# Patient Record
Sex: Male | Born: 2000 | Race: Black or African American | Hispanic: No | Marital: Single | Smoking: Never smoker
Health system: Southern US, Community
[De-identification: ages and names within clinical notes are randomized; demographics above are authoritative.]

---
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
## 2021-02-09 ENCOUNTER — Emergency Department (HOSPITAL_BASED_OUTPATIENT_CLINIC_OR_DEPARTMENT_OTHER): Payer: BC Managed Care – PPO

## 2021-02-09 ENCOUNTER — Encounter (HOSPITAL_BASED_OUTPATIENT_CLINIC_OR_DEPARTMENT_OTHER): Payer: Self-pay | Admitting: *Deleted

## 2021-02-09 ENCOUNTER — Other Ambulatory Visit: Payer: Self-pay

## 2021-02-09 ENCOUNTER — Emergency Department (HOSPITAL_BASED_OUTPATIENT_CLINIC_OR_DEPARTMENT_OTHER)
Admission: EM | Admit: 2021-02-09 | Discharge: 2021-02-09 | Disposition: A | Discharge status: Home / Self Care | Payer: BC Managed Care – PPO | Attending: Emergency Medicine | Admitting: Emergency Medicine

## 2021-02-09 DIAGNOSIS — Z20822 Contact with and (suspected) exposure to covid-19: Secondary | ICD-10-CM | POA: Diagnosis not present

## 2021-02-09 DIAGNOSIS — R1084 Generalized abdominal pain: Secondary | ICD-10-CM | POA: Diagnosis not present

## 2021-02-09 DIAGNOSIS — R1010 Upper abdominal pain, unspecified: Secondary | ICD-10-CM | POA: Diagnosis present

## 2021-02-09 DIAGNOSIS — R110 Nausea: Secondary | ICD-10-CM | POA: Insufficient documentation

## 2021-02-09 LAB — COMPREHENSIVE METABOLIC PANEL
ALT: 18 U/L (ref 0–44)
AST: 20 U/L (ref 15–41)
Albumin: 4 g/dL (ref 3.5–5.0)
Alkaline Phosphatase: 79 U/L (ref 38–126)
Anion gap: 8 (ref 5–15)
BUN: 9 mg/dL (ref 6–20)
CO2: 24 mmol/L (ref 22–32)
Calcium: 8.9 mg/dL (ref 8.9–10.3)
Chloride: 104 mmol/L (ref 98–111)
Creatinine, Ser: 0.68 mg/dL (ref 0.61–1.24)
GFR, Estimated: >60 mL/min (ref >60)
Glucose, Bld: 92 mg/dL (ref 70–99)
Potassium: 3.7 mmol/L (ref 3.5–5.1)
Sodium: 136 mmol/L (ref 135–145)
Total Bilirubin: 0.4 mg/dL (ref 0.3–1.2)
Total Protein: 6.8 g/dL (ref 6.5–8.1)

## 2021-02-09 LAB — URINALYSIS, ROUTINE W REFLEX MICROSCOPIC
Bilirubin Urine: NEGATIVE
Glucose, UA: NEGATIVE mg/dL
Hgb urine dipstick: NEGATIVE
Ketones, ur: NEGATIVE mg/dL
Leukocytes,Ua: NEGATIVE
Nitrite: NEGATIVE
Protein, ur: NEGATIVE mg/dL
Specific Gravity, Urine: <1.005 — ABNORMAL LOW (ref 1.005–1.030)
pH: 8 (ref 5.0–8.0)

## 2021-02-09 LAB — CBC WITH DIFFERENTIAL/PLATELET
Abs Immature Granulocytes: 0.02 10*3/uL (ref 0.00–0.07)
Basophils Absolute: 0 10*3/uL (ref 0.0–0.1)
Basophils Relative: 0 %
Eosinophils Absolute: 0.3 10*3/uL (ref 0.0–0.5)
Eosinophils Relative: 3 %
HCT: 44.6 % (ref 39.0–52.0)
Hemoglobin: 15.2 g/dL (ref 13.0–17.0)
Immature Granulocytes: 0 %
Lymphocytes Relative: 39 %
Lymphs Abs: 4.3 10*3/uL — ABNORMAL HIGH (ref 0.7–4.0)
MCH: 27.7 pg (ref 26.0–34.0)
MCHC: 34.1 g/dL (ref 30.0–36.0)
MCV: 81.4 fL (ref 80.0–100.0)
Monocytes Absolute: 0.6 10*3/uL (ref 0.1–1.0)
Monocytes Relative: 5 %
Neutro Abs: 6 10*3/uL (ref 1.7–7.7)
Neutrophils Relative %: 53 %
Platelets: 288 10*3/uL (ref 150–400)
RBC: 5.48 MIL/uL (ref 4.22–5.81)
RDW: 13.5 % (ref 11.5–15.5)
WBC: 11.2 10*3/uL — ABNORMAL HIGH (ref 4.0–10.5)
nRBC: 0 % (ref 0.0–0.2)

## 2021-02-09 LAB — LIPASE, BLOOD: Lipase: 28 U/L (ref 11–51)

## 2021-02-09 MED ORDER — ONDANSETRON 4 MG PO TBDP: 4.0000 mg | ORAL_TABLET | Freq: Three times a day (TID) | ORAL | 0 refills | Status: AC | PRN

## 2021-02-09 MED ORDER — IOHEXOL 300 MG/ML  SOLN
100.0000 mL | Freq: Once | INTRAMUSCULAR | Status: AC | PRN
  Administered 2021-02-09: 100 mL via INTRAVENOUS

## 2021-02-09 MED ORDER — ONDANSETRON HCL 4 MG/2ML IJ SOLN
4.0000 mg | Freq: Once | INTRAMUSCULAR | Status: AC
  Administered 2021-02-09: 4 mg via INTRAVENOUS
  Filled 2021-02-09: qty 2

## 2021-02-09 MED ORDER — FENTANYL CITRATE (PF) 100 MCG/2ML IJ SOLN
50.0000 ug | Freq: Once | INTRAMUSCULAR | Status: AC
  Administered 2021-02-09: 50 ug via INTRAVENOUS
  Filled 2021-02-09: qty 2

## 2021-02-09 NOTE — ED Notes (Signed)
Water given for PO challenge.

## 2021-02-09 NOTE — ED Provider Notes (Signed)
[facility] EMERGENCY DEPARTMENT Provider Note   CSN: [id number] Arrival date & time: 02/09/21  1859     History Chief Complaint  Patient presents with  . Abdominal Pain    Lawrence Cisneros is a 20 y.o. male.  Presents to ER with concern for abdominal pain.  States that pain started this afternoon.  Yesterday felt fine.  Has had some associated nausea but no vomiting or diarrhea.  Pain is primarily in his upper abdomen.  Per report negative for COVID and flu.  Sent from urgent care for further eval.  Denies medical problems.  HPI     History reviewed. No pertinent past medical history.  There are no problems to display for this patient.   Past Surgical History:  Procedure Laterality Date  . NOSE SURGERY         No family history on file.  Social History   Tobacco Use  . Smoking status: Never Smoker  . Smokeless tobacco: Never Used  Substance Use Topics  . Alcohol use: No    Home Medications Prior to Admission medications   Medication Sig Start Date End Date Taking? Authorizing Provider  ondansetron (ZOFRAN ODT) 4 MG disintegrating tablet Take 1 tablet (4 mg total) by mouth every 8 (eight) hours as needed for nausea or vomiting. 02/09/21  Yes Amiir Heckard, Quitman Livings, MD  cephALEXin (KEFLEX) 500 MG capsule Take 1 capsule (500 mg total) by mouth 3 (three) times daily. 11/26/15   Hagler, Jami L, PA-C  doxycycline (VIBRAMYCIN) 100 MG capsule Take 1 capsule (100 mg total) by mouth 2 (two) times daily. 06/24/15   Viviano Simas, NP    Allergies    Patient has no known allergies.  Review of Systems   Review of Systems  Constitutional: Negative for chills and fever.  HENT: Negative for ear pain and sore throat.   Eyes: Negative for pain and visual disturbance.  Respiratory: Negative for cough and shortness of breath.   Cardiovascular: Negative for chest pain and palpitations.  Gastrointestinal: Positive for abdominal pain and nausea. Negative for vomiting.   Genitourinary: Negative for dysuria and hematuria.  Musculoskeletal: Negative for arthralgias and back pain.  Skin: Negative for color change and rash.  Neurological: Negative for seizures and syncope.  All other systems reviewed and are negative.   Physical Exam Updated Vital Signs BP 124/77 (BP Location: Left Arm)   Pulse (!) 50   Temp (!) 97.5 F (36.4 C) (Oral)   Resp (!) 21   Ht 6\' 3"  (1.905 m)   Wt 93.9 kg   SpO2 98%   BMI 25.87 kg/m   Physical Exam Vitals and nursing note reviewed.  Constitutional:      Appearance: He is well-developed.  HENT:     Head: Normocephalic and atraumatic.  Eyes:     Conjunctiva/sclera: Conjunctivae normal.  Cardiovascular:     Rate and Rhythm: Normal rate and regular rhythm.     Heart sounds: No murmur heard.   Pulmonary:     Effort: Pulmonary effort is normal. No respiratory distress.     Breath sounds: Normal breath sounds.  Abdominal:     Palpations: Abdomen is soft.     Tenderness: There is no abdominal tenderness.     Comments: Some generalized tenderness to palpation, no rebound or guarding  Musculoskeletal:     Cervical back: Neck supple.  Skin:    General: Skin is warm and dry.  Neurological:     Mental Status: He is  alert.     ED Results / Procedures / Treatments   Labs (all labs ordered are listed, but only abnormal results are displayed) Labs Reviewed  CBC WITH DIFFERENTIAL/PLATELET - Abnormal; Notable for the following components:      Result Value   WBC 11.2 (*)    Lymphs Abs 4.3 (*)    All other components within normal limits  URINALYSIS, ROUTINE W REFLEX MICROSCOPIC - Abnormal; Notable for the following components:   Specific Gravity, Urine <1.005 (*)    All other components within normal limits  COMPREHENSIVE METABOLIC PANEL  LIPASE, BLOOD    EKG None  Radiology CT ABDOMEN PELVIS W CONTRAST  Result Date: 02/09/2021 CLINICAL DATA:  Epigastric and left abdominal pain today.  Nausea. EXAM: CT  ABDOMEN AND PELVIS WITH CONTRAST TECHNIQUE: Multidetector CT imaging of the abdomen and pelvis was performed using the standard protocol following bolus administration of intravenous contrast. CONTRAST:  OMNIPAQUE IOHEXOL 300 MG/ML  SOLN COMPARISON:  None. FINDINGS: Lower chest: Lung bases are clear. Hepatobiliary: No focal liver abnormality is seen. No gallstones, gallbladder wall thickening, or biliary dilatation. Pancreas: Unremarkable. No pancreatic ductal dilatation or surrounding inflammatory changes. Spleen: Normal in size without focal abnormality. Adrenals/Urinary Tract: No adrenal gland nodules. Hyperdense foci in both kidneys may represent stones but more likely are due to early concentration of contrast material in the calices. Nephrograms are symmetrical. No hydronephrosis or hydroureter. Bladder wall is thickened which may indicate cystitis. Stomach/Bowel: Stomach is within normal limits. Appendix appears normal. No evidence of bowel wall thickening, distention, or inflammatory changes. Vascular/Lymphatic: No significant vascular findings are present. No enlarged abdominal or pelvic lymph nodes. Reproductive: Prostate is unremarkable. Other: Small periumbilical hernia containing fat. No free air or free fluid in the abdomen. Musculoskeletal: No acute or significant osseous findings. IMPRESSION: 1. Bladder wall thickening may indicate cystitis. 2. Hyperdense foci in both kidneys may represent stones but more likely are due to early concentration of contrast material in the calices. No hydronephrosis or hydroureter. 3. Small periumbilical hernia containing fat. Electronically Signed   By: Burman Nieves M.D.   On: 02/09/2021 21:52    Procedures Procedures   Medications Ordered in ED Medications  ondansetron (ZOFRAN) injection 4 mg (4 mg Intravenous Given 02/09/21 2025)  fentaNYL (SUBLIMAZE) injection 50 mcg (50 mcg Intravenous Given 02/09/21 2024)  iohexol (OMNIPAQUE) 300 MG/ML solution  100 mL (100 mLs Intravenous Contrast Given 02/09/21 2121)    ED Course  I have reviewed the triage vital signs and the nursing notes.  Pertinent labs & imaging results that were available during my care of the patient were reviewed by me and considered in my medical decision making (see chart for details).    MDM Rules/Calculators/A&P                         20 year old male presenting to ER with concern for abdominal pain.  Basic labs are all stable.  UA negative for infection.  CT negative for acute pathology.  Did demonstrate some mild bladder wall thickening but given UA, doubt cystitis.  No specific urinary symptoms.  On reassessment his symptoms had resolved and he tolerated p.o. without difficulty.  Discharged home with mother.  Recommend follow-up with primary doctor, return for worsening symptoms.  After the discussed management above, the patient was determined to be safe for discharge.  The patient was in agreement with this plan and all questions regarding their care were answered.  ED return precautions were discussed and the patient will return to the ED with any significant worsening of condition.   Final Clinical Impression(s) / ED Diagnoses Final diagnoses:  Generalized abdominal pain    Rx / DC Orders ED Discharge Orders         Ordered    ondansetron (ZOFRAN ODT) 4 MG disintegrating tablet  Every 8 hours PRN        02/09/21 2259           Milagros Loll, MD 02/09/21 2337

## 2021-02-09 NOTE — Discharge Instructions (Signed)
Take Zofran as needed for nausea or pain.  Follow-up with your primary care doctor.  If your pain and nausea is uncontrolled, recommend return to ER for reassessment.

## 2021-02-09 NOTE — ED Notes (Signed)
Patient transported to CT 

## 2021-02-09 NOTE — ED Triage Notes (Signed)
C/o mid abd pain x 5 hrs today sent here by [facility] for eval denies d/v. NEg for covid and flu

## 2021-08-05 ENCOUNTER — Ambulatory Visit: Payer: BC Managed Care – PPO | Admitting: Sports Medicine

## 2022-01-13 IMAGING — CT CT ABD-PELV W/ CM
2 of 4 series · 15 of 46 positions shown, 17 images · IV contrast (Omnipaque)
Comparison: None.

CLINICAL DATA: Epigastric and left abdominal pain today.  Nausea.

EXAM:
CT ABDOMEN AND PELVIS WITH CONTRAST
TECHNIQUE: Multidetector CT imaging of the abdomen and pelvis was performed
using the standard protocol following bolus administration of
intravenous contrast.
CONTRAST:  100mL OMNIPAQUE IOHEXOL 300 MG/ML  SOLN

[Series 2: axial st · axial · 0.98mm/px · z∈[-641,-196]mm · 12 of 103 slices shown, 14 images]
[im 9/103  soft-tissue]
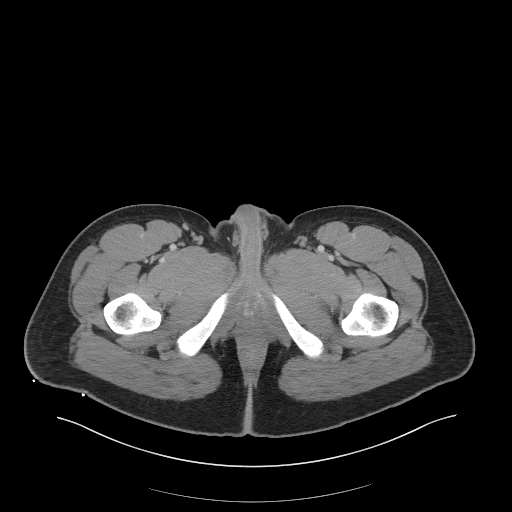
[im 9/103  bone]
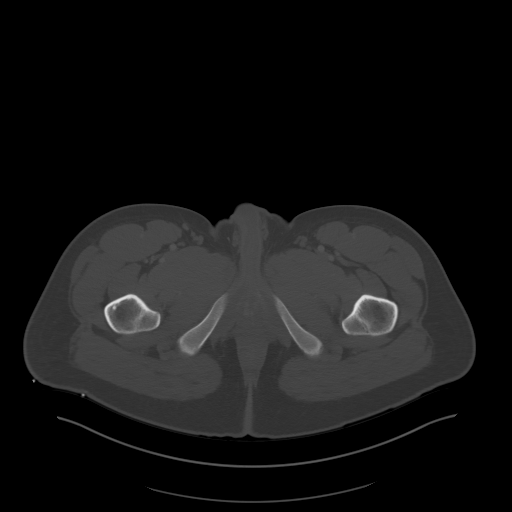
[im 17/103  soft-tissue]
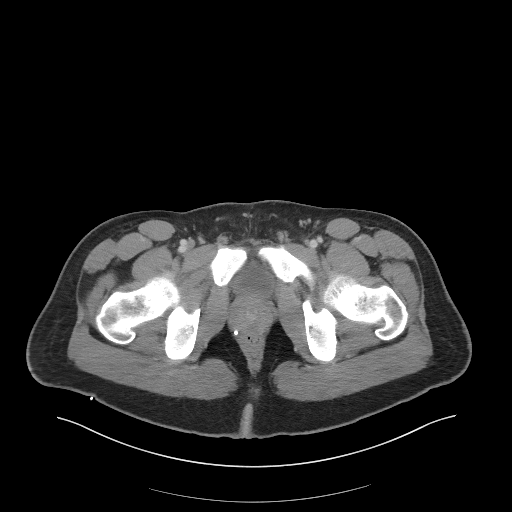
[im 25/103  soft-tissue]
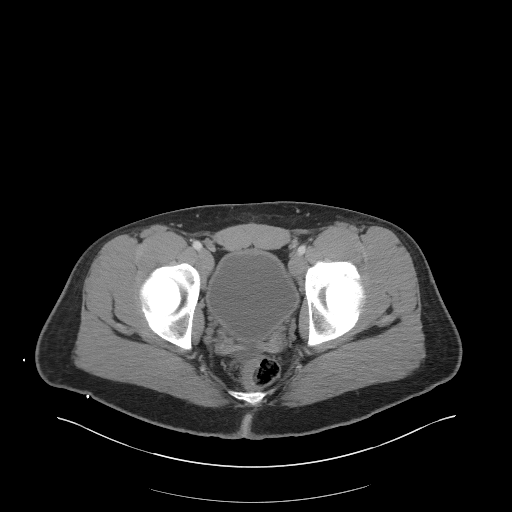
[im 33/103  soft-tissue]
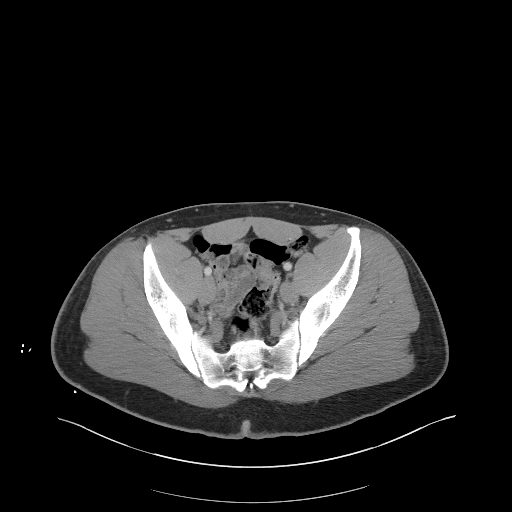
[im 41/103  soft-tissue]
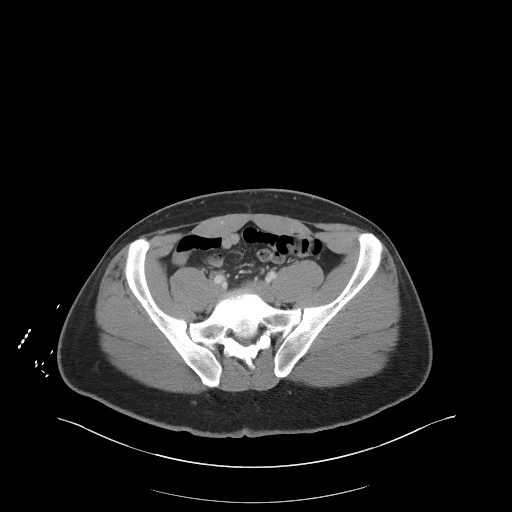
[im 49/103  soft-tissue]
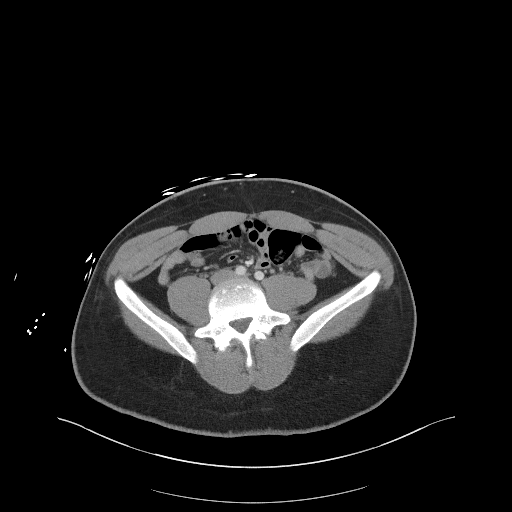
[im 58/103  soft-tissue]
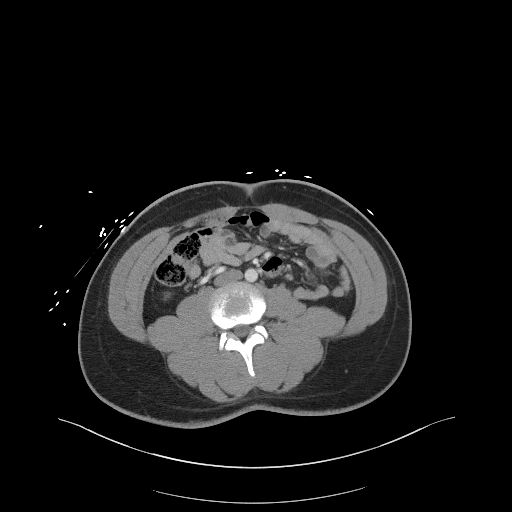
[im 66/103  soft-tissue]
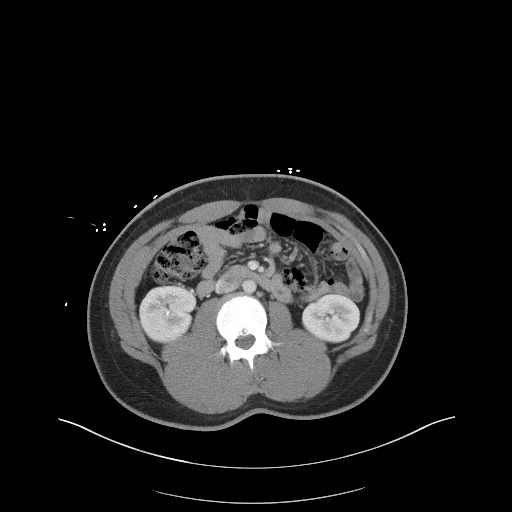
[im 74/103  soft-tissue]
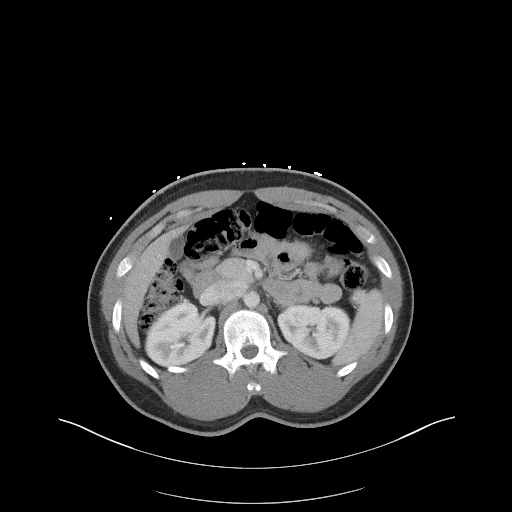
[im 74/103  bone]
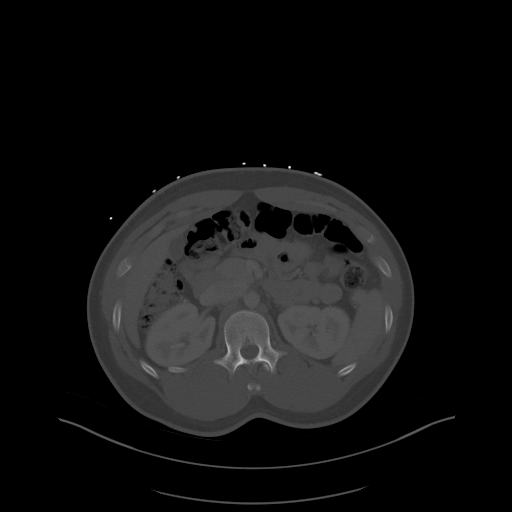
[im 82/103  soft-tissue]
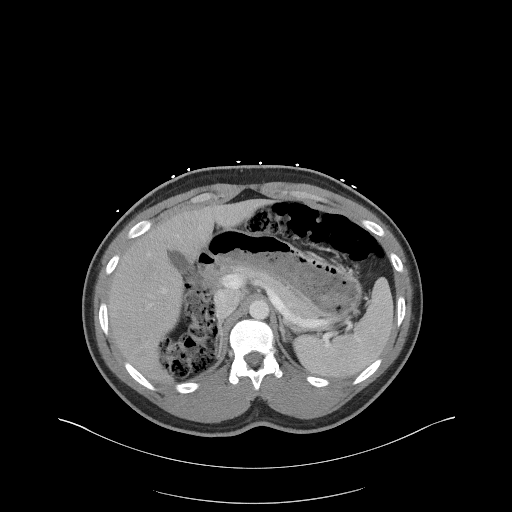
[im 90/103  soft-tissue]
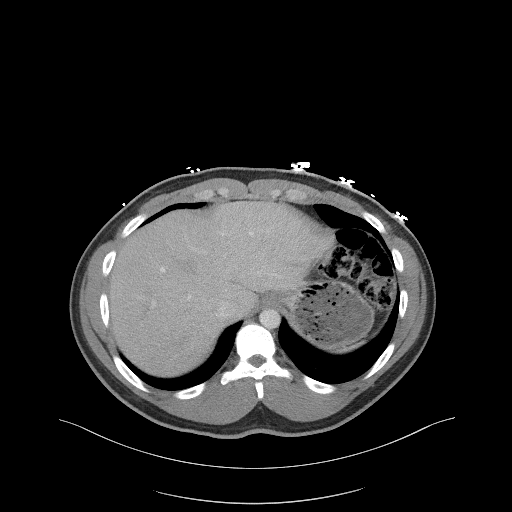
[im 98/103  soft-tissue]
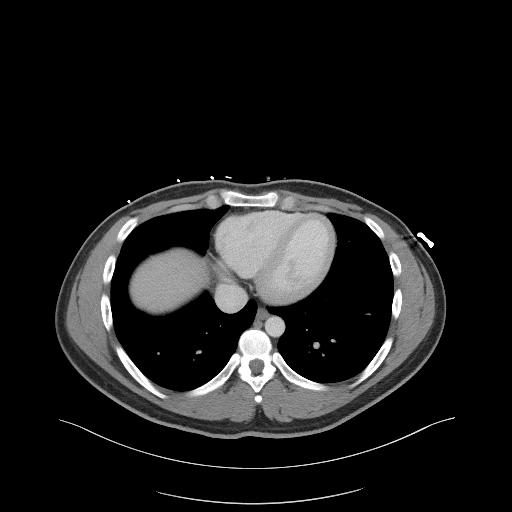

[Series 5: coronal st · coronal · 0.68mm/px · 3 of 100 slices shown]
[im 34/100  soft-tissue]
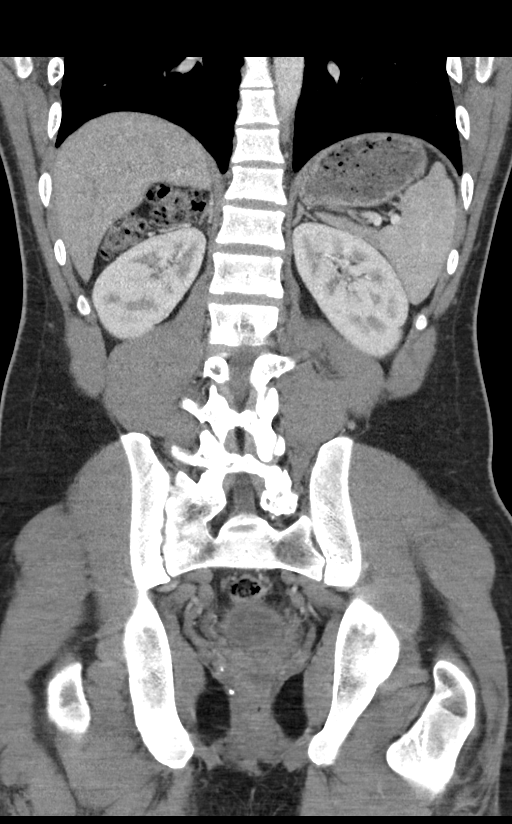
[im 45/100  soft-tissue]
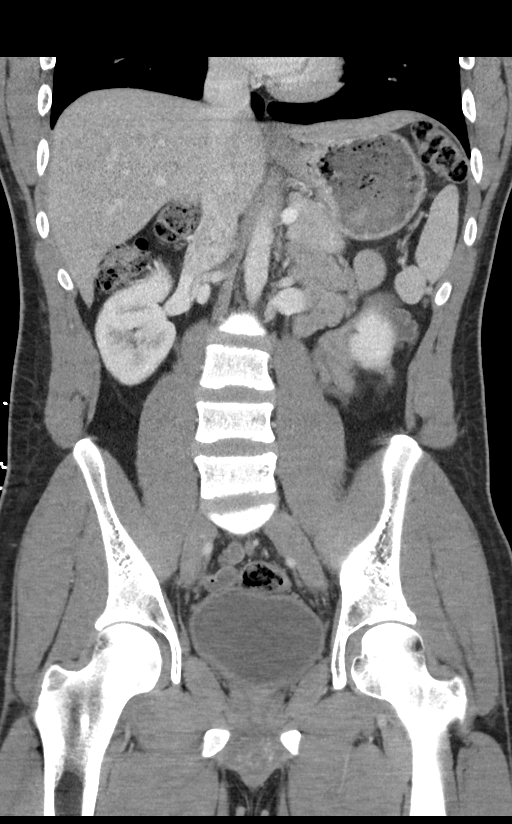
[im 56/100  soft-tissue]
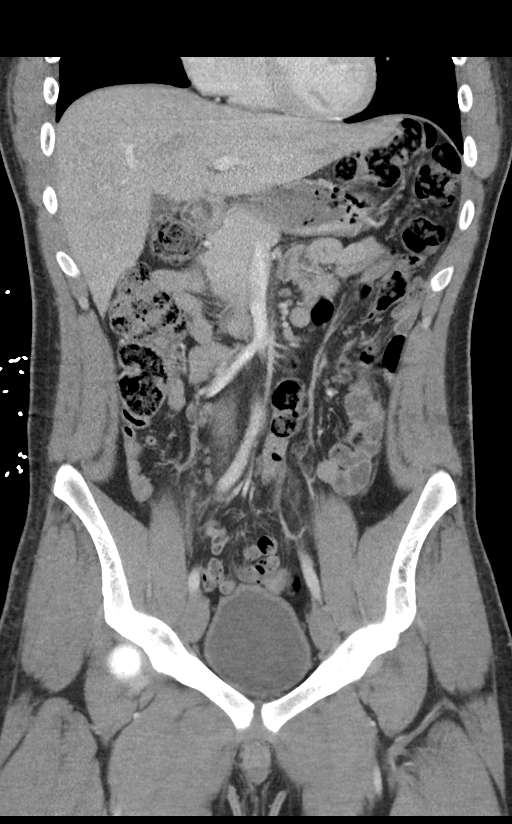

[15 of 46 positions shown; findings below may reference images not displayed]

FINDINGS: Lower chest: Lung bases are clear.

Hepatobiliary: No focal liver abnormality is seen. No gallstones,
gallbladder wall thickening, or biliary dilatation.

Pancreas: Unremarkable. No pancreatic ductal dilatation or
surrounding inflammatory changes.

Spleen: Normal in size without focal abnormality.

Adrenals/Urinary Tract: No adrenal gland nodules. Hyperdense foci in
both kidneys may represent stones but more likely are due to early
concentration of contrast material in the calices. Nephrograms are
symmetrical. No hydronephrosis or hydroureter. Bladder wall is
thickened which may indicate cystitis.

Stomach/Bowel: Stomach is within normal limits. Appendix appears
normal. No evidence of bowel wall thickening, distention, or
inflammatory changes.

Vascular/Lymphatic: No significant vascular findings are present. No
enlarged abdominal or pelvic lymph nodes.

Reproductive: Prostate is unremarkable.

Other: Small periumbilical hernia containing fat. No free air or
free fluid in the abdomen.

Musculoskeletal: No acute or significant osseous findings.
IMPRESSION: 1. Bladder wall thickening may indicate cystitis.
2. Hyperdense foci in both kidneys may represent stones but more
likely are due to early concentration of contrast material in the
calices. No hydronephrosis or hydroureter.
3. Small periumbilical hernia containing fat.
